# Patient Record
Sex: Female | Born: 1970 | Race: White | Hispanic: No | Marital: Married | Smoking: Never smoker
Health system: Southern US, Community
[De-identification: ages and names within clinical notes are randomized; demographics above are authoritative.]

## PROBLEM LIST (undated history)

## (undated) DIAGNOSIS — G250 Essential tremor: Secondary | ICD-10-CM

## (undated) DIAGNOSIS — F419 Anxiety disorder, unspecified: Secondary | ICD-10-CM

---

## 2000-03-17 ENCOUNTER — Encounter: Admission: RE | Admit: 2000-03-17 | Discharge: 2000-03-17 | Payer: Self-pay | Admitting: *Deleted

## 2000-03-17 ENCOUNTER — Encounter: Payer: Self-pay | Admitting: *Deleted

## 2003-12-30 ENCOUNTER — Other Ambulatory Visit: Admission: RE | Admit: 2003-12-30 | Discharge: 2003-12-30 | Payer: Self-pay | Admitting: Obstetrics and Gynecology

## 2011-04-07 ENCOUNTER — Other Ambulatory Visit (HOSPITAL_COMMUNITY)
Admission: RE | Admit: 2011-04-07 | Discharge: 2011-04-07 | Disposition: A | Discharge status: Home / Self Care | Payer: No Typology Code available for payment source | Source: Ambulatory Visit | Attending: Family Medicine | Admitting: Family Medicine

## 2011-04-07 ENCOUNTER — Other Ambulatory Visit: Payer: Self-pay | Admitting: Family Medicine

## 2011-04-07 DIAGNOSIS — Z124 Encounter for screening for malignant neoplasm of cervix: Secondary | ICD-10-CM | POA: Insufficient documentation

## 2011-04-07 DIAGNOSIS — Z1231 Encounter for screening mammogram for malignant neoplasm of breast: Secondary | ICD-10-CM

## 2011-04-07 DIAGNOSIS — Z1159 Encounter for screening for other viral diseases: Secondary | ICD-10-CM | POA: Insufficient documentation

## 2011-04-21 ENCOUNTER — Ambulatory Visit: Payer: Self-pay

## 2011-05-13 ENCOUNTER — Ambulatory Visit
Admission: RE | Admit: 2011-05-13 | Discharge: 2011-05-13 | Disposition: A | Discharge status: Home / Self Care | Payer: No Typology Code available for payment source | Source: Ambulatory Visit | Attending: Family Medicine | Admitting: Family Medicine

## 2011-05-13 DIAGNOSIS — Z1231 Encounter for screening mammogram for malignant neoplasm of breast: Secondary | ICD-10-CM

## 2015-04-30 ENCOUNTER — Other Ambulatory Visit: Payer: Self-pay | Admitting: Family Medicine

## 2015-04-30 DIAGNOSIS — N6310 Unspecified lump in the right breast, unspecified quadrant: Secondary | ICD-10-CM

## 2015-05-06 ENCOUNTER — Other Ambulatory Visit: Payer: Self-pay | Admitting: Family Medicine

## 2015-05-06 ENCOUNTER — Ambulatory Visit
Admission: RE | Admit: 2015-05-06 | Discharge: 2015-05-06 | Disposition: A | Discharge status: Home / Self Care | Payer: No Typology Code available for payment source | Source: Ambulatory Visit | Attending: Family Medicine | Admitting: Family Medicine

## 2015-05-06 DIAGNOSIS — N6310 Unspecified lump in the right breast, unspecified quadrant: Secondary | ICD-10-CM

## 2015-05-06 DIAGNOSIS — R928 Other abnormal and inconclusive findings on diagnostic imaging of breast: Secondary | ICD-10-CM

## 2015-05-16 ENCOUNTER — Ambulatory Visit
Admission: RE | Admit: 2015-05-16 | Discharge: 2015-05-16 | Disposition: A | Discharge status: Home / Self Care | Payer: No Typology Code available for payment source | Source: Ambulatory Visit | Attending: Family Medicine | Admitting: Family Medicine

## 2015-05-16 ENCOUNTER — Other Ambulatory Visit: Payer: Self-pay | Admitting: Family Medicine

## 2015-05-16 DIAGNOSIS — N6310 Unspecified lump in the right breast, unspecified quadrant: Secondary | ICD-10-CM

## 2015-05-16 DIAGNOSIS — R928 Other abnormal and inconclusive findings on diagnostic imaging of breast: Secondary | ICD-10-CM

## 2015-05-29 ENCOUNTER — Other Ambulatory Visit: Payer: Self-pay | Admitting: General Surgery

## 2015-05-29 DIAGNOSIS — N6091 Unspecified benign mammary dysplasia of right breast: Secondary | ICD-10-CM

## 2015-06-04 ENCOUNTER — Other Ambulatory Visit: Payer: Self-pay | Admitting: General Surgery

## 2015-06-04 DIAGNOSIS — N6091 Unspecified benign mammary dysplasia of right breast: Secondary | ICD-10-CM

## 2015-06-05 ENCOUNTER — Encounter (HOSPITAL_BASED_OUTPATIENT_CLINIC_OR_DEPARTMENT_OTHER): Payer: Self-pay | Admitting: *Deleted

## 2015-06-09 ENCOUNTER — Encounter (HOSPITAL_BASED_OUTPATIENT_CLINIC_OR_DEPARTMENT_OTHER)
Admission: RE | Admit: 2015-06-09 | Discharge: 2015-06-09 | Disposition: A | Discharge status: Home / Self Care | Payer: No Typology Code available for payment source | Source: Ambulatory Visit | Attending: General Surgery | Admitting: General Surgery

## 2015-06-09 DIAGNOSIS — N6091 Unspecified benign mammary dysplasia of right breast: Secondary | ICD-10-CM | POA: Diagnosis present

## 2015-06-09 DIAGNOSIS — D241 Benign neoplasm of right breast: Secondary | ICD-10-CM | POA: Diagnosis not present

## 2015-06-09 DIAGNOSIS — F419 Anxiety disorder, unspecified: Secondary | ICD-10-CM | POA: Diagnosis not present

## 2015-06-09 LAB — CBC WITH DIFFERENTIAL/PLATELET
BASOS PCT: 1 %
Basophils Absolute: 0.1 10*3/uL (ref 0.0–0.1)
EOS ABS: 0.2 10*3/uL (ref 0.0–0.7)
EOS PCT: 3 %
HCT: 39.2 % (ref 36.0–46.0)
Hemoglobin: 11.9 g/dL — ABNORMAL LOW (ref 12.0–15.0)
Lymphocytes Relative: 29 %
Lymphs Abs: 2 10*3/uL (ref 0.7–4.0)
MCH: 26.3 pg (ref 26.0–34.0)
MCHC: 30.4 g/dL (ref 30.0–36.0)
MCV: 86.7 fL (ref 78.0–100.0)
MONO ABS: 0.7 10*3/uL (ref 0.1–1.0)
MONOS PCT: 10 %
NEUTROS PCT: 57 %
Neutro Abs: 4.1 10*3/uL (ref 1.7–7.7)
PLATELETS: 313 10*3/uL (ref 150–400)
RBC: 4.52 MIL/uL (ref 3.87–5.11)
RDW: 15.6 % — AB (ref 11.5–15.5)
WBC: 7 10*3/uL (ref 4.0–10.5)

## 2015-06-09 LAB — COMPREHENSIVE METABOLIC PANEL
ALBUMIN: 3.9 g/dL (ref 3.5–5.0)
ALT: 13 U/L — ABNORMAL LOW (ref 14–54)
ANION GAP: 6 (ref 5–15)
AST: 16 U/L (ref 15–41)
Alkaline Phosphatase: 48 U/L (ref 38–126)
BUN: 9 mg/dL (ref 6–20)
CHLORIDE: 105 mmol/L (ref 101–111)
CO2: 27 mmol/L (ref 22–32)
Calcium: 9.9 mg/dL (ref 8.9–10.3)
Creatinine, Ser: 0.72 mg/dL (ref 0.44–1.00)
GFR calc Af Amer: >60 mL/min (ref >60)
GFR calc non Af Amer: >60 mL/min (ref >60)
GLUCOSE: 89 mg/dL (ref 65–99)
POTASSIUM: 4.6 mmol/L (ref 3.5–5.1)
SODIUM: 138 mmol/L (ref 135–145)
Total Bilirubin: 0.5 mg/dL (ref 0.3–1.2)
Total Protein: 7 g/dL (ref 6.5–8.1)

## 2015-06-09 NOTE — H&P (Signed)
Marie Massey  Location: [facility] Patient #: [id number] DOB: [birth year] Married / Language: English / Race: White Female       History of Present Illness  The patient is a 44 year old female who presents with a complaint of atypical ductal hyperplasia right breast 2. This is a delightful 44 year old Caucasian female. She is here with her husband throughout the encounter. She is referred by Dr. Theda Sers at the breast center  for evaluation and management of 2 palpable masses in the right breast lower inner quadrant which had been biopsied and showed fibroadenoma with atypical ductal hyperplasia.     The patient has no prior history of breast problems. She has a one-month history of feeling a lump in her right breast in the lower inner quadrant. She went to the [facility] [place]. Imaging studies show a 1.8 cm mass and a 1.7 cm mass in the lower inner quadrant, one at the 4 o'clock position and one at the 5 o'clock position. They found a cyst in the upper outer quadrant of the left breast. Biopsies of the 2 areas in the right breast showed fibroadenoma with atypical ductal hyperplasia in both areas. She's done well since the biopsy.    Family history is negative for breast cancer. Mother is adopted. Family history is negative for ovarian or pancreatic cancer. Past medical history is fairly negative. She is she has some anxiety and takes medication for that. She is married with 2 sons. 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  We had a very lengthy discussion about her 2 areas. We talked about the atypical hyperplasia and how it is a risk factor for cancer. We talked about the low but definite chance there may be in situ per cancer present. We talked about referral to high risk breast clinic. I advised her and her husband that , at this point in time, all that is indicated is conservative excision.  She has decided to go ahead with this procedure. She will  be scheduled for right breast lumpectomy 2 with double wire localization. These are too close together to use radioactive seed. I discussed the indications, details, techniques, and numerous risk of the surgery with her and her husband. She is aware of the risk of bleeding, infection, cosmetic deformity, further surgery if there is cancer present, nerve damage with chronic pain, and other unforeseen problems. She understands these issues well. At this time all of her questions were answered. She agrees with this plan.    Past Surgical History  Colon Polyp Removal - Colonoscopy  Diagnostic Studies History  Colonoscopy 1-5 years ago Mammogram within last year Pap Smear 1-5 years ago  Allergies  No Known Drug Allergies10/13/2016  Medication History  Sertraline HCl (100MG  Tablet, Oral) Active. Medications Reconciled  Social History  Alcohol use Occasional alcohol use. Caffeine use Tea. No drug use Tobacco use Never smoker.  Family History  Alcohol Abuse Father. Arthritis Mother. Depression Family Members In General. Diabetes Mellitus Family Members In General. Heart Disease Father. Hypertension Brother, Father, Son. Melanoma Brother, Mother. Respiratory Condition Son.  Pregnancy / Birth History  Age at menarche 42 years. Gravida 2 Maternal age 70-25 Para 2 Regular periods    Review of Systems  General Not Present- Appetite Loss, Chills, Fatigue, Fever, Night Sweats, Weight Gain and Weight Loss. Skin Not Present- Change in Wart/Mole, Dryness, Hives, Jaundice, New Lesions, Non-Healing Wounds, Rash and Ulcer. HEENT Present- Wears glasses/contact lenses. Not Present- Earache, Hearing Loss, Hoarseness, Nose Bleed, Oral Ulcers, Ringing  in the Ears, Seasonal Allergies, Sinus Pain, Sore Throat, Visual Disturbances and Yellow Eyes. Breast Present- Breast Mass. Not Present- Breast Pain, Nipple Discharge and Skin Changes. Cardiovascular Not Present-  Chest Pain, Difficulty Breathing Lying Down, Leg Cramps, Palpitations, Rapid Heart Rate, Shortness of Breath and Swelling of Extremities. Gastrointestinal Not Present- Abdominal Pain, Bloating, Bloody Stool, Change in Bowel Habits, Chronic diarrhea, Constipation, Difficulty Swallowing, Excessive gas, Gets full quickly at meals, Hemorrhoids, Indigestion, Nausea, Rectal Pain and Vomiting. Female Genitourinary Not Present- Frequency, Nocturia, Painful Urination, Pelvic Pain and Urgency. Musculoskeletal Not Present- Back Pain, Joint Pain, Joint Stiffness, Muscle Pain, Muscle Weakness and Swelling of Extremities. Neurological Present- Tremor. Not Present- Decreased Memory, Fainting, Headaches, Numbness, Seizures, Tingling, Trouble walking and Weakness. Psychiatric Not Present- Anxiety, Bipolar, Change in Sleep Pattern, Depression, Fearful and Frequent crying. Endocrine Not Present- Cold Intolerance, Excessive Hunger, Hair Changes, Heat Intolerance, Hot flashes and New Diabetes. Hematology Not Present- Easy Bruising, Excessive bleeding, Gland problems, HIV and Persistent Infections.  Vitals   Weight: 157 lb Height: 72in Body Surface Area: 1.9 m Body Mass Index: 21.29 kg/m  Temp.: 97.84F(Temporal)  Pulse: 70 (Regular)  BP: 124/76 (Sitting, Left Arm, Standard)     Physical Exam  General Mental Status-Alert. General Appearance-Consistent with stated age. Hydration-Well hydrated. Voice-Normal.  Head and Neck Head-normocephalic, atraumatic with no lesions or palpable masses. Trachea-midline. Thyroid Gland Characteristics - normal size and consistency.  Eye Eyeball - Bilateral-Extraocular movements intact. Sclera/Conjunctiva - Bilateral-No scleral icterus.  Chest and Lung Exam Chest and lung exam reveals -quiet, even and easy respiratory effort with no use of accessory muscles and on auscultation, normal breath sounds, no adventitious sounds and normal vocal  resonance. Inspection Chest Wall - Normal. Back - normal.  Breast Note: Right breast reveals a little bit of bruising but no hematoma. Medium size. I can feel two-1 cm masses in the lower inner quadrant, and agree that they are about at 4:00 and 5 o'clock position. There are about 2 or 3 cm apart from each other. Probably can be removed with single incision. No other mass in either breast. No other skin change. No axillary adenopathy.   Cardiovascular Cardiovascular examination reveals -normal heart sounds, regular rate and rhythm with no murmurs and normal pedal pulses bilaterally.  Abdomen Inspection Inspection of the abdomen reveals - No Hernias. Skin - Scar - no surgical scars. Palpation/Percussion Palpation and Percussion of the abdomen reveal - Soft, Non Tender, No Rebound tenderness, No Rigidity (guarding) and No hepatosplenomegaly. Auscultation Auscultation of the abdomen reveals - Bowel sounds normal.  Neurologic Neurologic evaluation reveals -alert and oriented x 3 with no impairment of recent or remote memory. Mental Status-Normal.  Musculoskeletal Normal Exam - Left-Upper Extremity Strength Normal and Lower Extremity Strength Normal. Normal Exam - Right-Upper Extremity Strength Normal and Lower Extremity Strength Normal.  Lymphatic Head & Neck  General Head & Neck Lymphatics: Bilateral - Description - Normal. Axillary  General Axillary Region: Bilateral - Description - Normal. Tenderness - Non Tender. Femoral & Inguinal  Generalized Femoral & Inguinal Lymphatics: Bilateral - Description - Normal. Tenderness - Non Tender.    Assessment & Plan  ATYPICAL DUCTAL HYPERPLASIA OF RIGHT BREAST (N60.91)  You are being scheduled for surgery - Our schedulers will call you. You have 2 palpable masses in the right breast, lower inner quadrant. Both of these have been biopsied and both show a fibroadenoma and both show atypical ductal hyperplasia Because of the  atypia, I have recommended that these areas be conservatively  excised We have discussed this in detail and we have decided to go ahead with the surgery You will be scheduled for right breast lumpectomy 2 with wire localization 2 We have discussed the indications, techniques, and numerous risks of this surgery in detail Please read the printed information that I have given you.  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 Dalbert Batman, M.D., [facility] General and Minimally invasive Surgery Breast and Colorectal Surgery Office:   [phone] Pager:   [phone]

## 2015-06-11 ENCOUNTER — Ambulatory Visit
Admission: RE | Admit: 2015-06-11 | Discharge: 2015-06-11 | Disposition: A | Discharge status: Home / Self Care | Payer: No Typology Code available for payment source | Source: Ambulatory Visit | Attending: General Surgery | Admitting: General Surgery

## 2015-06-11 ENCOUNTER — Ambulatory Visit (HOSPITAL_BASED_OUTPATIENT_CLINIC_OR_DEPARTMENT_OTHER): Payer: No Typology Code available for payment source | Admitting: Anesthesiology

## 2015-06-11 ENCOUNTER — Ambulatory Visit (HOSPITAL_BASED_OUTPATIENT_CLINIC_OR_DEPARTMENT_OTHER)
Admission: RE | Admit: 2015-06-11 | Discharge: 2015-06-11 | Disposition: A | Discharge status: Home / Self Care | Payer: No Typology Code available for payment source | Source: Ambulatory Visit | Attending: General Surgery | Admitting: General Surgery

## 2015-06-11 ENCOUNTER — Encounter (HOSPITAL_BASED_OUTPATIENT_CLINIC_OR_DEPARTMENT_OTHER): Payer: Self-pay | Admitting: *Deleted

## 2015-06-11 ENCOUNTER — Encounter (HOSPITAL_BASED_OUTPATIENT_CLINIC_OR_DEPARTMENT_OTHER)
Admission: RE | Disposition: A | Discharge status: Home / Self Care | Payer: Self-pay | Source: Ambulatory Visit | Attending: General Surgery

## 2015-06-11 DIAGNOSIS — F419 Anxiety disorder, unspecified: Secondary | ICD-10-CM | POA: Insufficient documentation

## 2015-06-11 DIAGNOSIS — D241 Benign neoplasm of right breast: Secondary | ICD-10-CM | POA: Diagnosis not present

## 2015-06-11 DIAGNOSIS — N6092 Unspecified benign mammary dysplasia of left breast: Secondary | ICD-10-CM | POA: Diagnosis present

## 2015-06-11 DIAGNOSIS — N6091 Unspecified benign mammary dysplasia of right breast: Secondary | ICD-10-CM

## 2015-06-11 DIAGNOSIS — D242 Benign neoplasm of left breast: Secondary | ICD-10-CM | POA: Diagnosis present

## 2015-06-11 HISTORY — DX: Anxiety disorder, unspecified: F41.9

## 2015-06-11 HISTORY — PX: BREAST LUMPECTOMY WITH NEEDLE LOCALIZATION: SHX5759

## 2015-06-11 HISTORY — DX: Essential tremor: G25.0

## 2015-06-11 SURGERY — BREAST LUMPECTOMY WITH NEEDLE LOCALIZATION
Anesthesia: General | Site: Breast | Laterality: Right

## 2015-06-11 MED ORDER — LIDOCAINE HCL (CARDIAC) 20 MG/ML IV SOLN
INTRAVENOUS | Status: AC
  Filled 2015-06-11: qty 5

## 2015-06-11 MED ORDER — BUPIVACAINE-EPINEPHRINE 0.5% -1:200000 IJ SOLN
INTRAMUSCULAR | Status: DC | PRN
  Administered 2015-06-11: 10 mL

## 2015-06-11 MED ORDER — ONDANSETRON HCL 4 MG/2ML IJ SOLN
INTRAMUSCULAR | Status: AC
  Filled 2015-06-11: qty 2

## 2015-06-11 MED ORDER — GLYCOPYRROLATE 0.2 MG/ML IJ SOLN
0.2000 mg | Freq: Once | INTRAMUSCULAR | Status: DC | PRN
Start: 2015-06-11 — End: 2015-06-11

## 2015-06-11 MED ORDER — MIDAZOLAM HCL 2 MG/2ML IJ SOLN
INTRAMUSCULAR | Status: AC
  Filled 2015-06-11: qty 4

## 2015-06-11 MED ORDER — KETOROLAC TROMETHAMINE 30 MG/ML IJ SOLN: 30.0000 mg | Freq: Once | INTRAMUSCULAR | Status: DC

## 2015-06-11 MED ORDER — FENTANYL CITRATE (PF) 100 MCG/2ML IJ SOLN
INTRAMUSCULAR | Status: AC
  Filled 2015-06-11: qty 4

## 2015-06-11 MED ORDER — HYDROCODONE-ACETAMINOPHEN 5-325 MG PO TABS
ORAL_TABLET | ORAL | Status: AC
  Filled 2015-06-11: qty 1

## 2015-06-11 MED ORDER — BUPIVACAINE-EPINEPHRINE (PF) 0.5% -1:200000 IJ SOLN
INTRAMUSCULAR | Status: AC
  Filled 2015-06-11: qty 30

## 2015-06-11 MED ORDER — SCOPOLAMINE 1 MG/3DAYS TD PT72
MEDICATED_PATCH | TRANSDERMAL | Status: AC
  Filled 2015-06-11: qty 1

## 2015-06-11 MED ORDER — DEXAMETHASONE SODIUM PHOSPHATE 4 MG/ML IJ SOLN
INTRAMUSCULAR | Status: DC | PRN
  Administered 2015-06-11: 10 mg via INTRAVENOUS

## 2015-06-11 MED ORDER — CEFAZOLIN SODIUM-DEXTROSE 2-3 GM-% IV SOLR
INTRAVENOUS | Status: AC
  Filled 2015-06-11: qty 50

## 2015-06-11 MED ORDER — MIDAZOLAM HCL 2 MG/2ML IJ SOLN
1.0000 mg | INTRAMUSCULAR | Status: DC | PRN
  Administered 2015-06-11: 2 mg via INTRAVENOUS

## 2015-06-11 MED ORDER — BUPIVACAINE-EPINEPHRINE (PF) 0.25% -1:200000 IJ SOLN
INTRAMUSCULAR | Status: AC
  Filled 2015-06-11: qty 30

## 2015-06-11 MED ORDER — PROMETHAZINE HCL 25 MG/ML IJ SOLN: 6.2500 mg | INTRAMUSCULAR | Status: DC | PRN

## 2015-06-11 MED ORDER — 0.9 % SODIUM CHLORIDE (POUR BTL) OPTIME
TOPICAL | Status: DC | PRN
  Administered 2015-06-11: 200 mL

## 2015-06-11 MED ORDER — ONDANSETRON HCL 4 MG/2ML IJ SOLN
INTRAMUSCULAR | Status: DC | PRN
  Administered 2015-06-11: 4 mg via INTRAVENOUS

## 2015-06-11 MED ORDER — LACTATED RINGERS IV SOLN
INTRAVENOUS | Status: DC
  Administered 2015-06-11: 11:00:00 via INTRAVENOUS

## 2015-06-11 MED ORDER — CEFAZOLIN SODIUM-DEXTROSE 2-3 GM-% IV SOLR
2.0000 g | INTRAVENOUS | Status: AC
  Administered 2015-06-11: 2 g via INTRAVENOUS

## 2015-06-11 MED ORDER — PROPOFOL 10 MG/ML IV BOLUS
INTRAVENOUS | Status: DC | PRN
  Administered 2015-06-11: 200 mg via INTRAVENOUS
  Administered 2015-06-11: 100 mg via INTRAVENOUS

## 2015-06-11 MED ORDER — HYDROCODONE-ACETAMINOPHEN 5-325 MG PO TABS: 1.0000 | ORAL_TABLET | Freq: Four times a day (QID) | ORAL | Status: DC | PRN

## 2015-06-11 MED ORDER — CHLORHEXIDINE GLUCONATE 4 % EX LIQD: 1.0000 "application " | Freq: Once | CUTANEOUS | Status: DC

## 2015-06-11 MED ORDER — HYDROCODONE-ACETAMINOPHEN 7.5-325 MG PO TABS: 1.0000 | ORAL_TABLET | Freq: Once | ORAL | Status: DC | PRN

## 2015-06-11 MED ORDER — DEXAMETHASONE SODIUM PHOSPHATE 10 MG/ML IJ SOLN
INTRAMUSCULAR | Status: AC
  Filled 2015-06-11: qty 1

## 2015-06-11 MED ORDER — SCOPOLAMINE 1 MG/3DAYS TD PT72
1.0000 | MEDICATED_PATCH | Freq: Once | TRANSDERMAL | Status: DC | PRN
  Administered 2015-06-11: 1.5 mg via TRANSDERMAL

## 2015-06-11 MED ORDER — HYDROMORPHONE HCL 1 MG/ML IJ SOLN: 0.2500 mg | INTRAMUSCULAR | Status: DC | PRN

## 2015-06-11 MED ORDER — HYDROCODONE-ACETAMINOPHEN 5-325 MG PO TABS
1.0000 | ORAL_TABLET | Freq: Once | ORAL | Status: AC | PRN
  Administered 2015-06-11: 1 via ORAL

## 2015-06-11 MED ORDER — FENTANYL CITRATE (PF) 100 MCG/2ML IJ SOLN
50.0000 ug | INTRAMUSCULAR | Status: DC | PRN
  Administered 2015-06-11: 100 ug via INTRAVENOUS

## 2015-06-11 SURGICAL SUPPLY — 61 items
APPLIER CLIP 11 MED OPEN (CLIP)
BANDAGE ELASTIC 6 VELCRO ST LF (GAUZE/BANDAGES/DRESSINGS) IMPLANT
BENZOIN TINCTURE PRP APPL 2/3 (GAUZE/BANDAGES/DRESSINGS) IMPLANT
BINDER BREAST LRG (GAUZE/BANDAGES/DRESSINGS) ×2 IMPLANT
BLADE HEX COATED 2.75 (ELECTRODE) ×2 IMPLANT
BLADE SURG 10 STRL SS (BLADE) IMPLANT
BLADE SURG 15 STRL LF DISP TIS (BLADE) ×1 IMPLANT
BLADE SURG 15 STRL SS (BLADE) ×1
CANISTER SUCT 1200ML W/VALVE (MISCELLANEOUS) ×2 IMPLANT
CHLORAPREP W/TINT 26ML (MISCELLANEOUS) ×2 IMPLANT
CLIP APPLIE 11 MED OPEN (CLIP) IMPLANT
COVER BACK TABLE 60X90IN (DRAPES) ×2 IMPLANT
COVER MAYO STAND STRL (DRAPES) ×2 IMPLANT
DECANTER SPIKE VIAL GLASS SM (MISCELLANEOUS) IMPLANT
DERMABOND ADVANCED (GAUZE/BANDAGES/DRESSINGS) ×1
DERMABOND ADVANCED .7 DNX12 (GAUZE/BANDAGES/DRESSINGS) ×1 IMPLANT
DEVICE DUBIN W/COMP PLATE 8390 (MISCELLANEOUS) ×2 IMPLANT
DRAIN CHANNEL 19F RND (DRAIN) IMPLANT
DRAIN HEMOVAC 1/8 X 5 (WOUND CARE) IMPLANT
DRAPE LAPAROSCOPIC ABDOMINAL (DRAPES) ×2 IMPLANT
DRAPE UTILITY XL STRL (DRAPES) ×2 IMPLANT
DRSG PAD ABDOMINAL 8X10 ST (GAUZE/BANDAGES/DRESSINGS) IMPLANT
ELECT REM PT RETURN 9FT ADLT (ELECTROSURGICAL) ×2
ELECTRODE REM PT RTRN 9FT ADLT (ELECTROSURGICAL) ×1 IMPLANT
EVACUATOR SILICONE 100CC (DRAIN) IMPLANT
GAUZE SPONGE 4X4 12PLY STRL (GAUZE/BANDAGES/DRESSINGS) ×2 IMPLANT
GAUZE SPONGE 4X4 16PLY XRAY LF (GAUZE/BANDAGES/DRESSINGS) IMPLANT
GLOVE BIOGEL PI IND STRL 7.0 (GLOVE) ×2 IMPLANT
GLOVE BIOGEL PI INDICATOR 7.0 (GLOVE) ×2
GLOVE ECLIPSE 6.5 STRL STRAW (GLOVE) ×2 IMPLANT
GLOVE EUDERMIC 7 POWDERFREE (GLOVE) ×2 IMPLANT
GLOVE SURG SS PI 7.0 STRL IVOR (GLOVE) ×2 IMPLANT
GOWN STRL REUS W/ TWL LRG LVL3 (GOWN DISPOSABLE) ×2 IMPLANT
GOWN STRL REUS W/ TWL XL LVL3 (GOWN DISPOSABLE) ×1 IMPLANT
GOWN STRL REUS W/TWL LRG LVL3 (GOWN DISPOSABLE) ×2
GOWN STRL REUS W/TWL XL LVL3 (GOWN DISPOSABLE) ×1
KIT MARKER MARGIN INK (KITS) ×2 IMPLANT
NEEDLE HYPO 25X1 1.5 SAFETY (NEEDLE) ×2 IMPLANT
NS IRRIG 1000ML POUR BTL (IV SOLUTION) ×2 IMPLANT
PACK BASIN DAY SURGERY FS (CUSTOM PROCEDURE TRAY) ×2 IMPLANT
PAD ALCOHOL SWAB (MISCELLANEOUS) ×2 IMPLANT
PENCIL BUTTON HOLSTER BLD 10FT (ELECTRODE) ×2 IMPLANT
PIN SAFETY STERILE (MISCELLANEOUS) IMPLANT
SLEEVE SCD COMPRESS KNEE MED (MISCELLANEOUS) ×2 IMPLANT
SPONGE GAUZE 4X4 12PLY STER LF (GAUZE/BANDAGES/DRESSINGS) IMPLANT
SPONGE LAP 18X18 X RAY DECT (DISPOSABLE) IMPLANT
SPONGE LAP 4X18 X RAY DECT (DISPOSABLE) IMPLANT
STRIP CLOSURE SKIN 1/2X4 (GAUZE/BANDAGES/DRESSINGS) IMPLANT
SUT ETHILON 3 0 FSL (SUTURE) IMPLANT
SUT MNCRL AB 4-0 PS2 18 (SUTURE) ×2 IMPLANT
SUT SILK 2 0 SH (SUTURE) ×2 IMPLANT
SUT VIC AB 2-0 CT1 27 (SUTURE)
SUT VIC AB 2-0 CT1 TAPERPNT 27 (SUTURE) IMPLANT
SUT VIC AB 3-0 SH 27 (SUTURE)
SUT VIC AB 3-0 SH 27X BRD (SUTURE) IMPLANT
SUT VICRYL 3-0 CR8 SH (SUTURE) ×2 IMPLANT
SYRINGE 10CC LL (SYRINGE) ×2 IMPLANT
TOWEL OR 17X24 6PK STRL BLUE (TOWEL DISPOSABLE) ×2 IMPLANT
TOWEL OR NON WOVEN STRL DISP B (DISPOSABLE) ×2 IMPLANT
TUBE CONNECTING 20X1/4 (TUBING) ×2 IMPLANT
YANKAUER SUCT BULB TIP NO VENT (SUCTIONS) ×2 IMPLANT

## 2015-06-11 NOTE — Transfer of Care (Signed)
Immediate Anesthesia Transfer of Care Note  Patient: Marie Massey  Procedure(s) Performed: Procedure(s): RIGHT BREAST LUMPECTOMY X 2  WITH 2 NEEDLE LOCALIZATION IN RIGHT BREAST (Right)  Patient Location: PACU  Anesthesia Type:General  Level of Consciousness: sedated  Airway & Oxygen Therapy: Patient Spontanous Breathing and Patient connected to face mask oxygen  Post-op Assessment: Report given to RN and Post -op Vital signs reviewed and stable  Post vital signs: Reviewed and stable  Last Vitals:  Filed Vitals:   06/11/15 1011  BP: 112/75  Pulse: 65  Temp: 36.6 C  Resp: 16    Complications: No apparent anesthesia complications

## 2015-06-11 NOTE — Discharge Instructions (Signed)
[facility] °Office Phone Number [phone] ° °BREAST BIOPSY/ PARTIAL MASTECTOMY: POST OP INSTRUCTIONS ° °Always review your discharge instruction sheet given to you by the facility where your surgery was performed. ° °IF YOU HAVE DISABILITY OR FAMILY LEAVE FORMS, YOU MUST BRING THEM TO THE OFFICE FOR PROCESSING.  DO NOT GIVE THEM TO YOUR DOCTOR. ° °1. A prescription for pain medication may be given to you upon discharge.  Take your pain medication as prescribed, if needed.  If narcotic pain medicine is not needed, then you may take acetaminophen (Tylenol) or ibuprofen (Advil) as needed. °2. Take your usually prescribed medications unless otherwise directed °3. If you need a refill on your pain medication, please contact your pharmacy.  They will contact our office to request authorization.  Prescriptions will not be filled after 5pm or on week-ends. °4. You should eat very light the first 24 hours after surgery, such as soup, crackers, pudding, etc.  Resume your normal diet the day after surgery. °5. Most patients will experience some swelling and bruising in the breast.  Ice packs and a good support bra will help.  Swelling and bruising can take several days to resolve.  °6. It is common to experience some constipation if taking pain medication after surgery.  Increasing fluid intake and taking a stool softener will usually help or prevent this problem from occurring.  A mild laxative (Milk of Magnesia or Miralax) should be taken according to package directions if there are no bowel movements after 48 hours. °7. Unless discharge instructions indicate otherwise, you may remove your bandages 24-48 hours after surgery, and you may shower at that time.  You may have steri-strips (small skin tapes) in place directly over the incision.  These strips should be left on the skin for 7-10 days.  If your surgeon used skin glue on the incision, you may shower in 24 hours.  The glue will flake off over the  next 2-3 weeks.  Any sutures or staples will be removed at the office during your follow-up visit. °8. ACTIVITIES:  You may resume regular daily activities (gradually increasing) beginning the next day.  Wearing a good support bra or sports bra minimizes pain and swelling.  You may have sexual intercourse when it is comfortable. °a. You may drive when you no longer are taking prescription pain medication, you can comfortably wear a seatbelt, and you can safely maneuver your car and apply brakes. °b. RETURN TO WORK:  ______________________________________________________________________________________ °9. You should see your doctor in the office for a follow-up appointment approximately two weeks after your surgery.  Your doctor’s nurse will typically make your follow-up appointment when she calls you with your pathology report.  Expect your pathology report 2-3 business days after your surgery.  You may call to check if you do not hear from us after three days. °10. OTHER INSTRUCTIONS: _______________________________________________________________________________________________ _____________________________________________________________________________________________________________________________________ °_____________________________________________________________________________________________________________________________________ °_____________________________________________________________________________________________________________________________________ ° °WHEN TO CALL YOUR DOCTOR: °1. Fever over 101.0 °2. Nausea and/or vomiting. °3. Extreme swelling or bruising. °4. Continued bleeding from incision. °5. Increased pain, redness, or drainage from the incision. ° °The clinic staff is available to answer your questions during regular business hours.  Please don’t hesitate to call and ask to speak to one of the nurses for clinical concerns.  If you have a medical emergency, go to the nearest  emergency room or call 911.  A surgeon from [facility] is always on call at the hospital. ° °For further questions, please visit [email or web address]  ° ° ° °  Post Anesthesia Home Care Instructions ° °Activity: °Get plenty of rest for the remainder of the day. A responsible adult should stay with you for 24 hours following the procedure.  °For the next 24 hours, DO NOT: °-Drive a car °-Operate machinery °-Drink alcoholic beverages °-Take any medication unless instructed by your physician °-Make any legal decisions or sign important papers. ° °Meals: °Start with liquid foods such as gelatin or soup. Progress to regular foods as tolerated. Avoid greasy, spicy, heavy foods. If nausea and/or vomiting occur, drink only clear liquids until the nausea and/or vomiting subsides. Call your physician if vomiting continues. ° °Special Instructions/Symptoms: °Your throat may feel dry or sore from the anesthesia or the breathing tube placed in your throat during surgery. If this causes discomfort, gargle with warm salt water. The discomfort should disappear within 24 hours. ° °If you had a scopolamine patch placed behind your ear for the management of post- operative nausea and/or vomiting: ° °1. The medication in the patch is effective for 72 hours, after which it should be removed.  Wrap patch in a tissue and discard in the trash. Wash hands thoroughly with soap and water. °2. You may remove the patch earlier than 72 hours if you experience unpleasant side effects which may include dry mouth, dizziness or visual disturbances. °3. Avoid touching the patch. Wash your hands with soap and water after contact with the patch. °  ° °

## 2015-06-11 NOTE — Anesthesia Preprocedure Evaluation (Signed)
Anesthesia Evaluation  Patient identified by MRN, date of birth, ID band Patient awake    Reviewed: Allergy & Precautions, NPO status , Patient's Chart, lab work & pertinent test results  Airway Mallampati: I  TM Distance: >3 FB Neck ROM: Full    Dental  (+) Teeth Intact, Dental Advisory Given   Pulmonary neg pulmonary ROS,    breath sounds clear to auscultation       Cardiovascular negative cardio ROS   Rhythm:Regular Rate:Normal     Neuro/Psych Anxiety negative neurological ROS     GI/Hepatic negative GI ROS, Neg liver ROS,   Endo/Other  negative endocrine ROS  Renal/GU negative Renal ROS     Musculoskeletal   Abdominal   Peds  Hematology negative hematology ROS (+)   Anesthesia Other Findings   Reproductive/Obstetrics                             Anesthesia Physical Anesthesia Plan  ASA: II  Anesthesia Plan: General   Post-op Pain Management:    Induction: Intravenous  Airway Management Planned: LMA  Additional Equipment:   Intra-op Plan:   Post-operative Plan:   Informed Consent: I have reviewed the patients History and Physical, chart, labs and discussed the procedure including the risks, benefits and alternatives for the proposed anesthesia with the patient or authorized representative who has indicated his/her understanding and acceptance.     Plan Discussed with: CRNA  Anesthesia Plan Comments:         Anesthesia Quick Evaluation

## 2015-06-11 NOTE — Anesthesia Procedure Notes (Signed)
Procedure Name: LMA Insertion Date/Time: 06/11/2015 11:35 AM Performed by: Lieutenant Diego Pre-anesthesia Checklist: Patient identified, Emergency Drugs available, Suction available and Patient being monitored Patient Re-evaluated:Patient Re-evaluated prior to inductionOxygen Delivery Method: Circle System Utilized Preoxygenation: Pre-oxygenation with 100% oxygen Intubation Type: IV induction Ventilation: Mask ventilation without difficulty LMA: LMA inserted LMA Size: 4.0 Number of attempts: 1 Airway Equipment and Method: Bite block Placement Confirmation: positive ETCO2 and breath sounds checked- equal and bilateral Tube secured with: Tape Dental Injury: Teeth and Oropharynx as per pre-operative assessment

## 2015-06-11 NOTE — Interval H&P Note (Signed)
History and Physical Interval Note:  06/11/2015 11:02 AM  Marie Massey  has presented today for surgery, with the diagnosis of fibroadenoma and atypical ductal hyperplasia right breast, lower inner quadrant  The various methods of treatment have been discussed with the patient and family. After consideration of risks, benefits and other options for treatment, the patient has consented to  Procedure(s): RIGHT BREAST LUMPECTOMY X 2  WITH 2 NEEDLE LOCALIZATION IN RIGHT BREAST (Right) X 2 as a surgical intervention .  The patient's history has been reviewed, patient examined, no change in status, stable for surgery.  I have reviewed the patient's chart and labs.  Questions were answered to the patient's satisfaction.     Adin Hector

## 2015-06-11 NOTE — OR Nursing (Signed)
Right Breast Tissue Specimen to Histology:  Yellow Pins mark clip @ E-11 and H-6

## 2015-06-11 NOTE — Anesthesia Postprocedure Evaluation (Signed)
  Anesthesia Post-op Note  Patient: Marie Massey  Procedure(s) Performed: Procedure(s): RIGHT BREAST LUMPECTOMY X 2  WITH 2 NEEDLE LOCALIZATION IN RIGHT BREAST (Right)  Patient Location: PACU  Anesthesia Type:General  Level of Consciousness: awake and alert   Airway and Oxygen Therapy: Patient Spontanous Breathing  Post-op Pain: none  Post-op Assessment: Post-op Vital signs reviewed              Post-op Vital Signs: Reviewed  Last Vitals:  Filed Vitals:   06/11/15 1415  BP: 124/64  Pulse: 74  Temp: 36.6 C  Resp: 16    Complications: No apparent anesthesia complications

## 2015-06-11 NOTE — Op Note (Signed)
Patient Name:           Marie Massey   Date of Surgery:        06/11/2015  Pre op Diagnosis:      Atypical ductal hyperplasia right breast, 2 separate areas  Post op Diagnosis:    Same  Procedure:                 Right breast partial mastectomy 2 with wire localization 2  Surgeon:                     Marie Massey, M.D., FACS  Assistant:                      OR staff  Operative Indications:   The patient is a 44 year old female who presents with a complaint of atypical ductal hyperplasia right breast 2. This is a delightful 44-year-old Caucasian female.She is referred by Dr. Collins at the breast center for evaluation and management of 2 palpable masses in the right breast lower inner quadrant which had been biopsied and showed fibroadenoma with atypical ductal hyperplasia.  The patient has no prior history of breast problems. She has a one-month history of feeling a lump in her right breast in the lower inner quadrant. She went to the [facility] [place]. Imaging studies show a 1.8 cm mass and a 1.7 cm mass in the lower inner quadrant, one at the 4 o'clock position and one at the 5 o'clock position. They found a cyst in the upper outer quadrant of the left breast. Biopsies of the 2 areas in the right breast showed fibroadenoma with atypical ductal hyperplasia in both areas. She's done well since the biopsy.  Family history is negative for breast cancer. Mother is adopted. Family history is negative for ovarian or pancreatic cancer.        Past medical history is fairly negative.  We had a very lengthy discussion about her 2 areas. We talked about the atypical hyperplasia and how it is a risk factor for cancer. We talked about the low but definite chance there may be in situ per cancer present. We talked about referral to high risk breast clinic. I advised her and her husband that , at this point in time, all that is indicated is conservative excision.   These areas are too close to each other to put radioactive seeds.  We decided to place 2 wires to be sure that we could remove both of these areas.  Operative Findings:       Both wires were placed from a medial to lateral orientation, but were well placed.  This allowed a transverse linear incision in the lower inner quadrant.  The specimen mammogram showed both wires and both masses and both clips to be present within the specimen.  Procedure in Detail:          Following the induction of general LMA anesthesia the patient's right breast was prepped and draped in a sterile fashion.  Intravenous antibiotics were given.  Surgical timeout was performed.  0.5% Marcaine with epinephrine was used as a local infiltration anesthetic.  After reviewing the imaging studies and the wire placement I elected to make a transverse linear incision between the 2 wires.  Dissection was carried down into the breast tissue around the localizing wires.  The specimen was marked with silk sutures and a 6 color ink kit to orient the pathologist.  Specimen   mammogram looked very good as described above.  The specimen was sent to the lab.  Hemostasis was excellent and achieved with  electrocautery.  Wound was irrigated.  The breast tissues were reapproximated with 3-0 Vicryl sutures and the skin closed with a running subcuticular 4-0 Monocryl and Dermabond.  Breast binder was placed and the patient taken to PACU in stable condition.  EBL 10 mL.  Counts correct.  Complications none.     Marie Massey, M.D., FACS General and Minimally Invasive Surgery Breast and Colorectal Surgery  06/11/2015 12:34 PM  

## 2015-06-12 ENCOUNTER — Encounter (HOSPITAL_BASED_OUTPATIENT_CLINIC_OR_DEPARTMENT_OTHER): Payer: Self-pay | Admitting: General Surgery

## 2015-07-09 DIAGNOSIS — Z23 Encounter for immunization: Secondary | ICD-10-CM | POA: Insufficient documentation

## 2015-07-09 DIAGNOSIS — Y9289 Other specified places as the place of occurrence of the external cause: Secondary | ICD-10-CM | POA: Insufficient documentation

## 2015-07-09 DIAGNOSIS — Y998 Other external cause status: Secondary | ICD-10-CM | POA: Diagnosis not present

## 2015-07-09 DIAGNOSIS — Z8669 Personal history of other diseases of the nervous system and sense organs: Secondary | ICD-10-CM | POA: Insufficient documentation

## 2015-07-09 DIAGNOSIS — F419 Anxiety disorder, unspecified: Secondary | ICD-10-CM | POA: Diagnosis not present

## 2015-07-09 DIAGNOSIS — Z79899 Other long term (current) drug therapy: Secondary | ICD-10-CM | POA: Insufficient documentation

## 2015-07-09 DIAGNOSIS — Y9389 Activity, other specified: Secondary | ICD-10-CM | POA: Diagnosis not present

## 2015-07-09 DIAGNOSIS — Z880 Allergy status to penicillin: Secondary | ICD-10-CM | POA: Diagnosis not present

## 2015-07-09 DIAGNOSIS — W268XXA Contact with other sharp object(s), not elsewhere classified, initial encounter: Secondary | ICD-10-CM | POA: Diagnosis not present

## 2015-07-09 DIAGNOSIS — S61214A Laceration without foreign body of right ring finger without damage to nail, initial encounter: Secondary | ICD-10-CM | POA: Insufficient documentation

## 2015-07-10 ENCOUNTER — Encounter (HOSPITAL_BASED_OUTPATIENT_CLINIC_OR_DEPARTMENT_OTHER): Payer: Self-pay | Admitting: Emergency Medicine

## 2015-07-10 ENCOUNTER — Emergency Department (HOSPITAL_BASED_OUTPATIENT_CLINIC_OR_DEPARTMENT_OTHER)
Admission: EM | Admit: 2015-07-10 | Discharge: 2015-07-10 | Disposition: A | Discharge status: Home / Self Care | Payer: No Typology Code available for payment source | Attending: Emergency Medicine | Admitting: Emergency Medicine

## 2015-07-10 DIAGNOSIS — S61219A Laceration without foreign body of unspecified finger without damage to nail, initial encounter: Secondary | ICD-10-CM

## 2015-07-10 MED ORDER — HYDROCODONE-ACETAMINOPHEN 5-325 MG PO TABS: 1.0000 | ORAL_TABLET | ORAL | Status: DC | PRN

## 2015-07-10 MED ORDER — TETANUS-DIPHTH-ACELL PERTUSSIS 5-2.5-18.5 LF-MCG/0.5 IM SUSP
0.5000 mL | Freq: Once | INTRAMUSCULAR | Status: AC
  Administered 2015-07-10: 0.5 mL via INTRAMUSCULAR
  Filled 2015-07-10: qty 0.5

## 2015-07-10 NOTE — ED Provider Notes (Signed)
TIME SEEN: 1:30 AM  CHIEF COMPLAINT: Right third digit laceration  HPI: Pt is a 44 y.o. female who presents emergency department with a skin avulsion to the right third digit. States that she was washing this is when a dish cut through the glove on her hand and sliced a piece of skin off of her right third finger. No other injury. She is unsure when her last tetanus vaccination was.  ROS: See HPI Constitutional: no fever  Eyes: no drainage  ENT: no runny nose   Cardiovascular:  no chest pain  Resp: no SOB  GI: no vomiting GU: no dysuria Integumentary: no rash  Allergy: no hives  Musculoskeletal: no leg swelling  Neurological: no slurred speech ROS otherwise negative  PAST MEDICAL HISTORY/PAST SURGICAL HISTORY:  Past Medical History  Diagnosis Date  . Anxiety   . Familial tremor     MEDICATIONS:  Prior to Admission medications   Medication Sig Start Date End Date Taking? Authorizing Provider  HYDROcodone-acetaminophen (NORCO) 5-325 MG tablet Take 1-2 tablets by mouth every 6 (six) hours as needed for moderate pain or severe pain. 06/11/15   Fanny Skates, MD  sertraline (ZOLOFT) 100 MG tablet Take 100 mg by mouth daily.    Historical Provider, MD    ALLERGIES:  Allergies  Allergen Reactions  . Penicillins Rash    SOCIAL HISTORY:  Social History  Substance Use Topics  . Smoking status: Never Smoker   . Smokeless tobacco: Never Used  . Alcohol Use: Yes     Comment: occas    FAMILY HISTORY: History reviewed. No pertinent family history.  EXAM: BP 132/74 mmHg  Pulse 79  Temp(Src) 97.9 F (36.6 C) (Oral)  Resp 18  Ht 6' (1.829 m)  Wt 156 lb (70.761 kg)  BMI 21.15 kg/m2  SpO2 100%  LMP 07/03/2015 CONSTITUTIONAL: Alert and oriented and responds appropriately to questions. Well-appearing; well-nourished HEAD: Normocephalic EYES: Conjunctivae clear, PERRL ENT: normal nose; no rhinorrhea; moist mucous membranes; pharynx without lesions noted NECK: Supple, no  meningismus, no LAD  CARD: RRR; S1 and S2 appreciated; no murmurs, no clicks, no rubs, no gallops RESP: Normal chest excursion without splinting or tachypnea; breath sounds clear and equal bilaterally; no wheezes, no rhonchi, no rales, no hypoxia or respiratory distress, speaking full sentences ABD/GI: Normal bowel sounds; non-distended; soft, non-tender, no rebound, no guarding, no peritoneal signs BACK:  The back appears normal and is non-tender to palpation, there is no CVA tenderness EXT: Normal ROM in all joints; non-tender to palpation; no edema; normal capillary refill; no cyanosis, no calf tenderness or swelling    SKIN: Normal color for age and race; warm; 3 x 1 cm area of avulsed skin with exposed subcutaneous tissue to the palmar aspect of the right third digit at the distal tip without tendon involvement NEURO: Moves all extremities equally, sensation to light touch intact diffusely, cranial nerves II through XII intact PSYCH: The patient's mood and manner are appropriate. Grooming and personal hygiene are appropriate.  MEDICAL DECISION MAKING: Patient here with an avulsion of skin to the palmar aspect of the right third digit. There is no tendon involvement and she is otherwise neurovascular intact distally. No other injury. There is no skin left that can be used to repair this wound and wound edges are not able to be pulled together to be sutured. This will have to heal by secondary intention.  I do not feel she needs to be on antibiotics at this time. We'll update  her tetanus vaccination. We have cleaned wound and applied bacitracin, Xeroform and sterile gauze. We'll have her follow-up with her PCP regularly for reevaluation. Discussed return precautions. Provided patient with pain medication to take as needed. She verbalized understanding and is comfortable with this plan.      [place], DO 07/10/15 [phone]

## 2015-07-10 NOTE — Discharge Instructions (Signed)

## 2015-07-10 NOTE — ED Notes (Signed)
Patient reports that she was washing dishes she cut her right middle finger.

## 2017-05-03 IMAGING — MG MM RT PLC BREAST LOC DEV   1ST LESION  INC MAMMO GUIDE
6 series · 6 of 6 positions shown · non-contrast
Comparison: Previous exams.

CLINICAL DATA: Patient with recent ultrasound-guided core needle
biopsy of 2 adjacent right breast masses demonstrating fibroadenomas
with ADH.

EXAM:
NEEDLE LOCALIZATION OF THE RIGHT BREAST WITH MAMMO GUIDANCE

[R ML (1 of 3)]
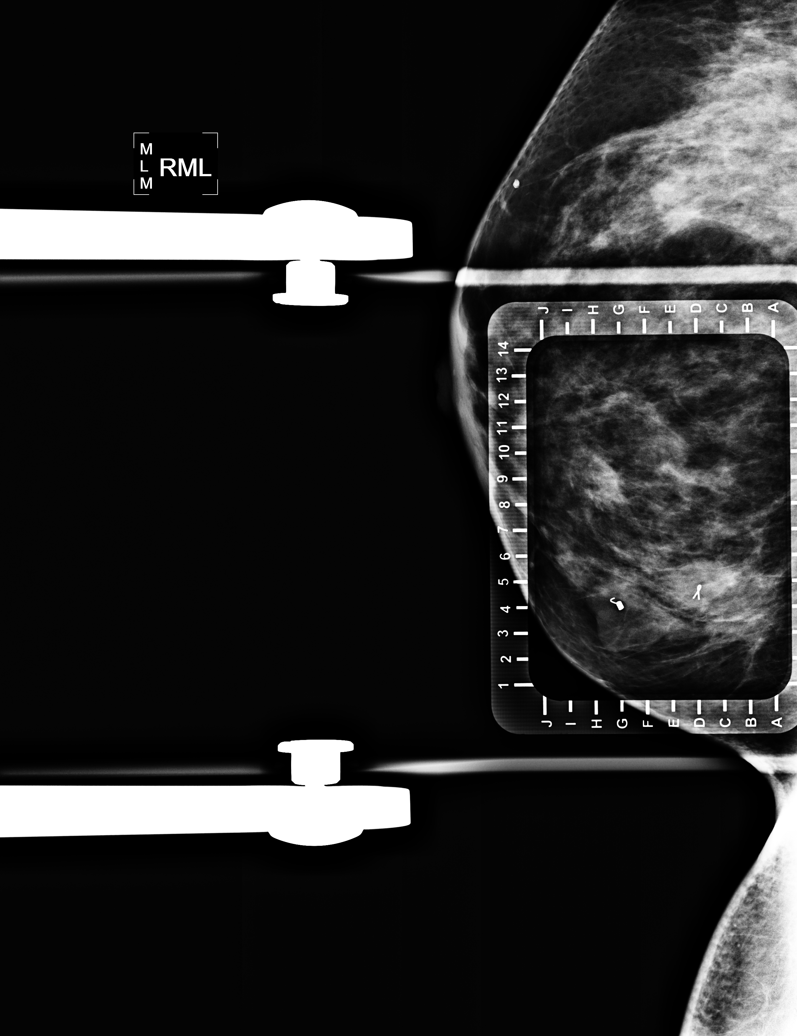

[R ML (2 of 3)]
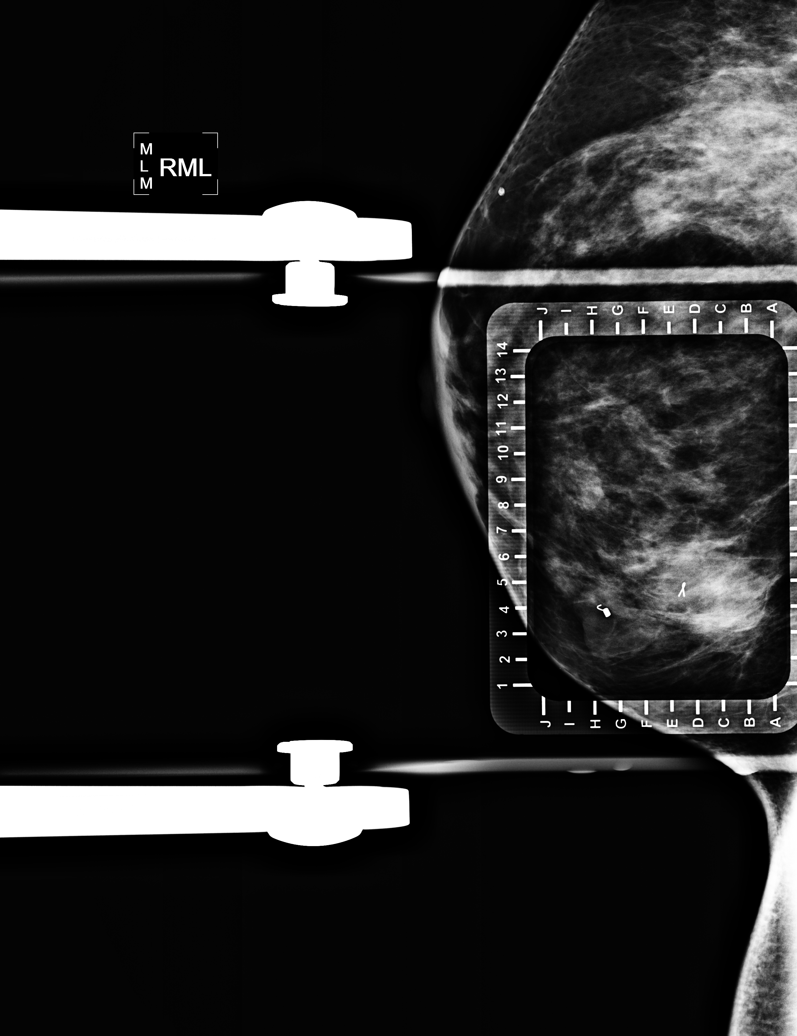

[R CC (1 of 3)]
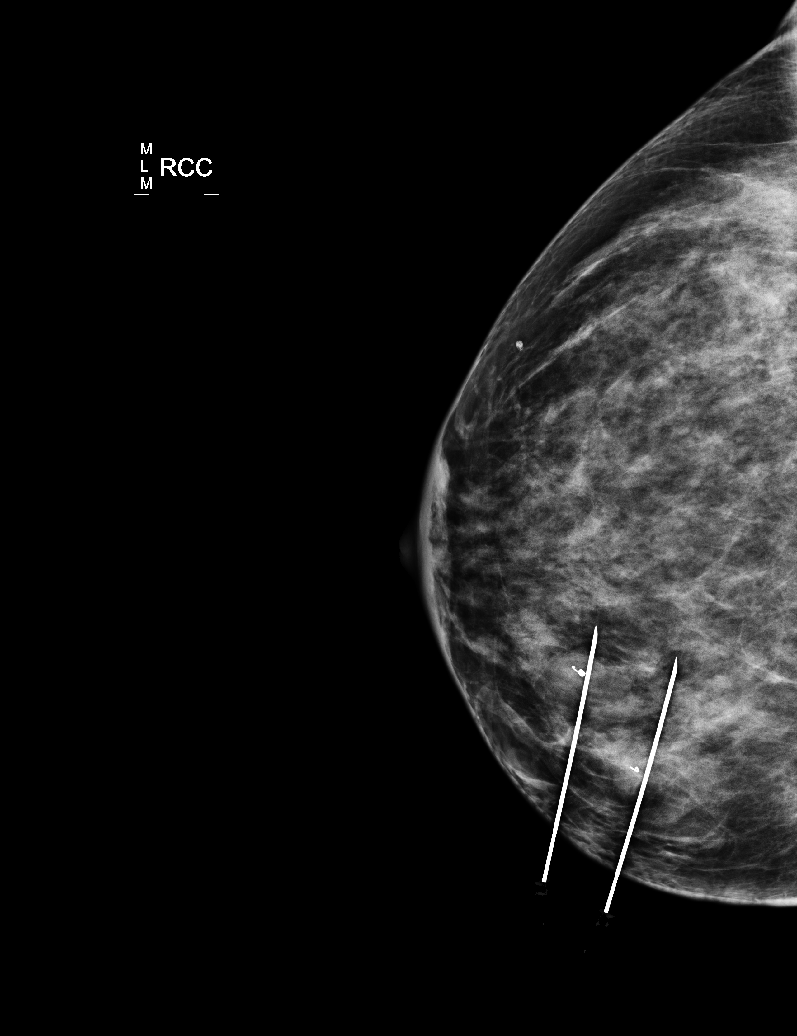

[R CC (2 of 3)]
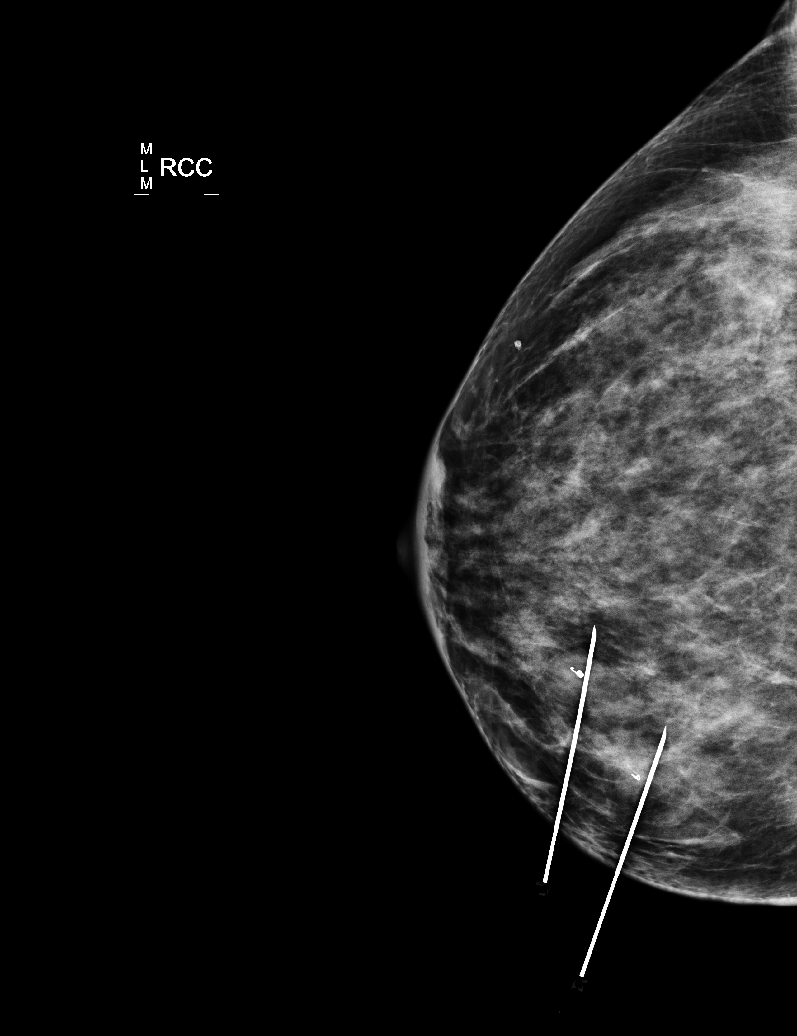

[R CC (3 of 3)]
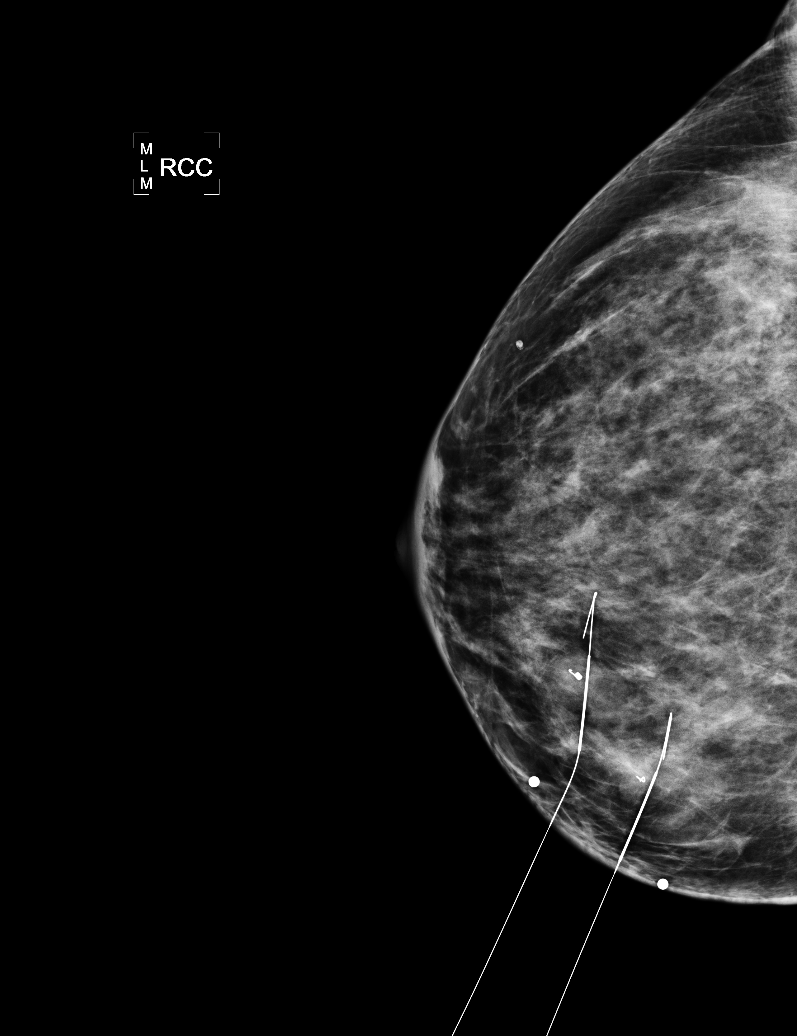

[R ML (3 of 3)]
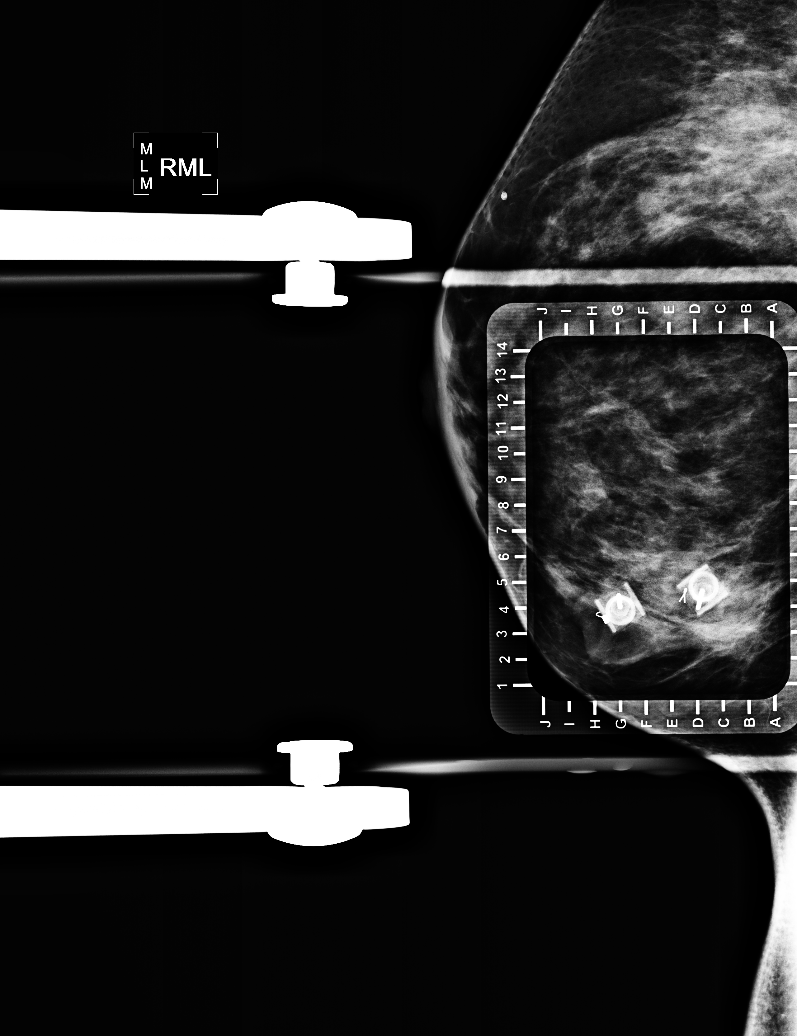

[6 of 6 positions shown; findings below may reference images not displayed]

FINDINGS: Patient presents for needle localization prior to right breast
surgical excision. I met with the patient and we discussed the
procedure of needle localization including benefits and
alternatives. We discussed the high likelihood of a successful
procedure. We discussed the risks of the procedure, including
infection, bleeding, tissue injury, and further surgery. Informed,
written consent was given. The usual time-out protocol was performed
immediately prior to the procedure.

Site 1:

Using mammographic guidance, sterile technique, 2% lidocaine and a 5
cm modified Kopans needle, mass and ribbon shaped marking clip were
localized using a medial approach.

Site 2:

Using mammographic guidance, sterile technique, 2% lidocaine and a 5
cm modified Kopans needle, mass and coil shaped marking clip were
localized using a medial approach.

The images were marked for Dr. Sandra Jose.
IMPRESSION: Needle localization of two adjacent masses within the medial right
breast. No apparent complications.

## 2017-10-10 ENCOUNTER — Encounter: Payer: Self-pay | Admitting: Neurology

## 2017-10-25 NOTE — Progress Notes (Signed)
Subjective:   Marie Massey was seen in consultation in the movement disorder clinic at the request of [name], [place].  The evaluation is for tremor. The records that were made available to me were reviewed from [name], [place] but nothing about tremor is contained therein. Pt is accompanied by her husband who supplements the history.  Tremor started approximately 10+ years ago and involves the bilateral UE.  She states that with the course of time she has noted "a bobble head" motion and husband notes that as well.  Husband thinks that it is in the "yes" direction.    Tremor is most noticeable when drinking, writing, eating.   There is a family hx of tremor in her mother/father/brother but it is mild and she wonders if they are anxiety related.    Affected by caffeine:  Yes.   (does not drink caffeine because of this) Affected by alcohol:  Unsure (drinks glass wine 3-4 days per week) Affected by stress:  Yes.   Affected by fatigue:  Not sure Spills soup if on spoon:  No. - but does have to be careful Spills glass of liquid if full:  Yes.   Affects ADL's (tying shoes, brushing teeth, etc):  No.  Notes worsening during the pre-menstrual period  Current/Previously tried tremor medications: none  Current medications that may exacerbate tremor:  n/a  Outside reports reviewed: historical medical records, office notes and referral letter/letters.  Allergies  Allergen Reactions  . Penicillins Rash    Outpatient Encounter Medications as of 10/27/2017  Medication Sig  . sertraline (ZOLOFT) 100 MG tablet Take 100 mg by mouth daily.  . [DISCONTINUED] HYDROcodone-acetaminophen (NORCO/VICODIN) 5-325 MG tablet Take 1 tablet by mouth every 4 (four) hours as needed.   No facility-administered encounter medications on file as of 10/27/2017.     Past Medical History:  Diagnosis Date  . Anxiety   . Familial tremor     Past Surgical History:  Procedure Laterality Date  .  BREAST LUMPECTOMY WITH NEEDLE LOCALIZATION Right 06/11/2015   Procedure: RIGHT BREAST LUMPECTOMY X 2  WITH 2 NEEDLE LOCALIZATION IN RIGHT BREAST;  Surgeon: Fanny Skates, MD;  Location: [facility];  Service: General;  Laterality: Right;    Social History   Socioeconomic History  . Marital status: Married    Spouse name: Not on file  . Number of children: Not on file  . Years of education: Not on file  . Highest education level: Not on file  Social Needs  . Financial resource strain: Not on file  . Food insecurity - worry: Not on file  . Food insecurity - inability: Not on file  . Transportation needs - medical: Not on file  . Transportation needs - non-medical: Not on file  Occupational History  . Not on file  Tobacco Use  . Smoking status: Never Smoker  . Smokeless tobacco: Never Used  Substance and Sexual Activity  . Alcohol use: Yes    Comment: wine 3-4 days a week  . Drug use: No  . Sexual activity: Not on file  Other Topics Concern  . Not on file  Social History Narrative  . Not on file    Family Status  Relation Name Status  . Mother  Alive  . Father  Alive  . Brother Social research officer, government  . Son x2 Alive    Review of Systems A complete 10 system ROS was obtained and was negative apart from what is mentioned.  Objective:   VITALS:   Vitals:   10/27/17 0940  BP: 120/84  Pulse: 74  SpO2: 95%  Weight: 173 lb (78.5 kg)  Height: 6' (1.829 m)   Gen:  Appears stated age and in NAD. HEENT:  Normocephalic, atraumatic. The mucous membranes are moist. The superficial temporal arteries are without ropiness or tenderness. Cardiovascular: Regular rate and rhythm. Lungs: Clear to auscultation bilaterally. Neck: There are no carotid bruits noted bilaterally.  NEUROLOGICAL:  Orientation:  The patient is alert and oriented x 3.  Recent and remote memory are intact.  Attention span and concentration are normal.  Able to name objects and repeat without trouble.   Fund of knowledge is appropriate Cranial nerves: There is good facial symmetry. The pupils are equal round and reactive to light bilaterally. Fundoscopic exam reveals clear disc margins bilaterally. Extraocular muscles are intact and visual fields are full to confrontational testing. Speech is fluent and clear. Soft palate rises symmetrically and there is no tongue deviation. Hearing is intact to conversational tone. Tone: Tone is good throughout. Sensation: Sensation is intact to light touch and pinprick throughout (facial, trunk, extremities). Vibration is intact at the bilateral big toe. There is no extinction with double simultaneous stimulation. There is no sensory dermatomal level identified. Coordination:  The patient has no dysdiadichokinesia or dysmetria. Motor: Strength is 5/5 in the bilateral upper and lower extremities.  Shoulder shrug is equal bilaterally.  There is no pronator drift.  There are no fasciculations noted. DTR's: Deep tendon reflexes are 2/4 at the bilateral biceps, triceps, brachioradialis, patella and achilles.  Plantar responses are downgoing bilaterally. Gait and Station: The patient is able to ambulate without difficulty. The patient is able to heel toe walk without any difficulty. The patient is able to ambulate in a tandem fashion. The patient is able to stand in the Romberg position.   MOVEMENT EXAM: Tremor:  There is tremor in the UE, noted most significantly with action.  The left is somewhat worse than the right (she is left-hand dominant).  When given a weight, tremor is just slightly increased.  The patient is no able to draw Archimedes spirals without significant difficulty.  There is no tremor at rest.  The patient is able to pour water from one glass to another and spills only a small amount.  Very mild head tremor in the "yes" direction is noted as she reports the water.  Labs: Called PCP.  Last labwork is 2015.     Assessment/Plan:   1.  Essential  Tremor.  -This is evidenced by the symmetrical nature and longstanding hx of gradually getting worse.  We discussed nature and pathophysiology.  We discussed that this can continue to gradually get worse with time.  We discussed that some medications can worsen this, as can caffeine use.  We discussed medication therapy as well as surgical therapy.  Ultimately, the patient decided to try primidone at low dose.  Risks, benefits, side effects and alternative therapies were discussed.  The opportunity to ask questions was given and they were answered to the best of my ability.  The patient expressed understanding and willingness to follow the outlined treatment protocols.  -We did discuss how anxiety can affect tremor and she admits she is quite anxious.  She asked about options for treatment and not involving medication.  We talked about weighted gloves.  We talked about the value of biofeedback and relaxation techniques.  Discussed integrative therapies and the phone number was given.  -The  patient does need lab work.  She has not had lab work since 2015.  We will do TSH, CBC, chemistry.  -She asked about potentially increasing her Zoloft.  That could be a treatment technique, if anxiety and mood are playing a strong role into tremor.  She can contact her primary care provider about this.  2.  Follow up is anticipated in the next few months, sooner should new neurologic issues arise.  Much greater than 50% of this visit was spent in counseling and coordinating care.  Total face to face time:  60 min  CC:  [name], PA-C

## 2017-10-27 ENCOUNTER — Encounter: Payer: Self-pay | Admitting: Neurology

## 2017-10-27 ENCOUNTER — Ambulatory Visit (INDEPENDENT_AMBULATORY_CARE_PROVIDER_SITE_OTHER): Payer: No Typology Code available for payment source | Admitting: Neurology

## 2017-10-27 ENCOUNTER — Other Ambulatory Visit: Payer: No Typology Code available for payment source

## 2017-10-27 VITALS — BP 120/84 | HR 74 | Ht 72.0 in | Wt 173.0 lb

## 2017-10-27 DIAGNOSIS — F419 Anxiety disorder, unspecified: Secondary | ICD-10-CM

## 2017-10-27 DIAGNOSIS — Z5181 Encounter for therapeutic drug level monitoring: Secondary | ICD-10-CM

## 2017-10-27 DIAGNOSIS — R251 Tremor, unspecified: Secondary | ICD-10-CM | POA: Diagnosis not present

## 2017-10-27 MED ORDER — PRIMIDONE 50 MG PO TABS: 50.0000 mg | ORAL_TABLET | Freq: Every day | ORAL | 3 refills | Status: AC

## 2017-10-27 NOTE — Patient Instructions (Signed)
1. Your provider has requested that you have labwork completed today. Please go to [facility] ([id number]) on the second floor of this building before leaving the office today. You do not need to check in. If you are not called within 15 minutes please check with the front desk.  2. Start Primidone 50 mg tablets. Take 1/2 tablet for 4 nights, then increase to 1 tablet. Prescription has been sent to your pharmacy. The first dose of medication can cause some dizziness/nausea that should go away after the first dose.  

## 2017-10-28 ENCOUNTER — Telehealth: Payer: Self-pay | Admitting: Neurology

## 2017-10-28 LAB — CBC WITH DIFFERENTIAL/PLATELET
BASOS PCT: 0.8 %
Basophils Absolute: 57 cells/uL (ref 0–200)
EOS ABS: 263 {cells}/uL (ref 15–500)
Eosinophils Relative: 3.7 %
HCT: 34.9 % — ABNORMAL LOW (ref 35.0–45.0)
Hemoglobin: 11.2 g/dL — ABNORMAL LOW (ref 11.7–15.5)
Lymphs Abs: 1889 cells/uL (ref 850–3900)
MCH: 24.9 pg — ABNORMAL LOW (ref 27.0–33.0)
MCHC: 32.1 g/dL (ref 32.0–36.0)
MCV: 77.6 fL — ABNORMAL LOW (ref 80.0–100.0)
MONOS PCT: 10.2 %
MPV: 11.1 fL (ref 7.5–12.5)
Neutro Abs: 4168 cells/uL (ref 1500–7800)
Neutrophils Relative %: 58.7 %
PLATELETS: 349 10*3/uL (ref 140–400)
RBC: 4.5 10*6/uL (ref 3.80–5.10)
RDW: 15.5 % — ABNORMAL HIGH (ref 11.0–15.0)
TOTAL LYMPHOCYTE: 26.6 %
WBC mixed population: 724 cells/uL (ref 200–950)
WBC: 7.1 10*3/uL (ref 3.8–10.8)

## 2017-10-28 LAB — COMPREHENSIVE METABOLIC PANEL
AG Ratio: 1.6 (calc) (ref 1.0–2.5)
ALT: 12 U/L (ref 6–29)
AST: 14 U/L (ref 10–35)
Albumin: 4.2 g/dL (ref 3.6–5.1)
Alkaline phosphatase (APISO): 52 U/L (ref 33–115)
BILIRUBIN TOTAL: 0.3 mg/dL (ref 0.2–1.2)
BUN: 15 mg/dL (ref 7–25)
CALCIUM: 9.7 mg/dL (ref 8.6–10.2)
CO2: 26 mmol/L (ref 20–32)
Chloride: 105 mmol/L (ref 98–110)
Creat: 0.75 mg/dL (ref 0.50–1.10)
GLUCOSE: 90 mg/dL (ref 65–99)
Globulin: 2.6 g/dL (calc) (ref 1.9–3.7)
Potassium: 5.2 mmol/L (ref 3.5–5.3)
SODIUM: 137 mmol/L (ref 135–146)
TOTAL PROTEIN: 6.8 g/dL (ref 6.1–8.1)

## 2017-10-28 LAB — TSH: TSH: 1.1 m[IU]/L

## 2017-10-28 NOTE — Telephone Encounter (Signed)
-----   Message from [place], DO sent at 10/28/2017  7:30 AM EDT ----- Let her know that labs look ok.  She is just mildly anemic but found labs from 2 years ago and its pretty stable since that time.

## 2017-10-28 NOTE — Telephone Encounter (Signed)
Patient made aware.

## 2017-11-04 ENCOUNTER — Encounter: Payer: Self-pay | Admitting: Neurology

## 2017-11-04 ENCOUNTER — Telehealth: Payer: Self-pay | Admitting: Neurology

## 2017-11-04 NOTE — Telephone Encounter (Signed)
Patient made aware her son's seizures would not be related to her tremors. I did let her know I would add it to her family history and make Dr. Carles Collet aware.   Dr. Carles Collet Juluis Rainier.

## 2017-11-04 NOTE — Telephone Encounter (Signed)
Patient wanted to let you know that her oldest son has had two grand mal seizures in the last year in half. She was not sure if the had anything to do with her DX or would help with the DX

## 2018-02-08 NOTE — Progress Notes (Deleted)
Subjective:   Marie Massey was seen in consultation in the movement disorder clinic at the request of [name], [place].  The evaluation is for tremor. The records that were made available to me were reviewed from [name], [place] but nothing about tremor is contained therein. Pt is accompanied by her husband who supplements the history.  Tremor started approximately 10+ years ago and involves the bilateral UE.  She states that with the course of time she has noted "a bobble head" motion and husband notes that as well.  Husband thinks that it is in the "yes" direction.    Tremor is most noticeable when drinking, writing, eating.   There is a family hx of tremor in her mother/father/brother but it is mild and she wonders if they are anxiety related.    Affected by caffeine:  Yes.   (does not drink caffeine because of this) Affected by alcohol:  Unsure (drinks glass wine 3-4 days per week) Affected by stress:  Yes.   Affected by fatigue:  Not sure Spills soup if on spoon:  No. - but does have to be careful Spills glass of liquid if full:  Yes.   Affects ADL's (tying shoes, brushing teeth, etc):  No.  Notes worsening during the pre-menstrual period  Current/Previously tried tremor medications: none  Current medications that may exacerbate tremor:  n/a  Outside reports reviewed: historical medical records, office notes and referral letter/letters.  02/09/18 update: Patient is seen today in follow-up.  She is accompanied by her husband who supplements the history.  Low-dose primidone was added last visit for tremor.  She reports today that ***.  Lab work was completed since our last visit.  She was mildly anemic, but that had not really changed in the last 2 years.  Allergies  Allergen Reactions  . Penicillins Rash    Outpatient Encounter Medications as of 02/09/2018  Medication Sig  . primidone (MYSOLINE) 50 MG tablet Take 1 tablet (50 mg total) by mouth at bedtime.  .  sertraline (ZOLOFT) 100 MG tablet Take 100 mg by mouth daily.   No facility-administered encounter medications on file as of 02/09/2018.     Past Medical History:  Diagnosis Date  . Anxiety   . Familial tremor     Past Surgical History:  Procedure Laterality Date  . BREAST LUMPECTOMY WITH NEEDLE LOCALIZATION Right 06/11/2015   Procedure: RIGHT BREAST LUMPECTOMY X 2  WITH 2 NEEDLE LOCALIZATION IN RIGHT BREAST;  Surgeon: Fanny Skates, MD;  Location: [facility];  Service: General;  Laterality: Right;    Social History   Socioeconomic History  . Marital status: Married    Spouse name: Not on file  . Number of children: Not on file  . Years of education: Not on file  . Highest education level: Not on file  Occupational History  . 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 Financial resource strain: Not on file  . Food insecurity:    Worry: Not on file    Inability: Not on file  . Transportation needs:    Medical: Not on file    Non-medical: Not on file  Tobacco Use  . Smoking status: Never Smoker  . Smokeless tobacco: Never Used  Substance and Sexual Activity  . Alcohol use: Yes    Comment: wine 3-4 days a week  . Drug use: No  . Sexual activity: Not on file  Lifestyle  . Physical activity:    Days per week: Not on file    Minutes per session: Not on file  . Stress: Not on file  Relationships  . Social connections:    Talks on phone: Not on file    Gets together: Not on file    Attends religious service: Not on file    Active member of club or organization: Not on file    Attends meetings of clubs or organizations: Not on file    Relationship status: Not on file  . Intimate partner violence:    Fear of current or ex partner: Not on file    Emotionally abused: Not on file    Physically abused: Not on file    Forced sexual activity: Not on file  Other Topics Concern  . Not on file  Social  History Narrative  . Not on file    Family Status  Relation Name Status  . Mother  Alive  . Father  Alive  . Brother Social research officer, government  . Son x2 Alive    Review of Systems A complete 10 system ROS was obtained and was negative apart from what is mentioned.   Objective:   VITALS:   There were no vitals filed for this visit. Gen:  Appears stated age and in NAD. HEENT:  Normocephalic, atraumatic. The mucous membranes are moist. The superficial temporal arteries are without ropiness or tenderness. Cardiovascular: Regular rate and rhythm. Lungs: Clear to auscultation bilaterally. Neck: There are no carotid bruits noted bilaterally.  NEUROLOGICAL:  Orientation:  The patient is alert and oriented x 3.  Recent and remote memory are intact.  Attention span and concentration are normal.  Able to name objects and repeat without trouble.  Fund of knowledge is appropriate Cranial nerves: There is good facial symmetry. The pupils are equal round and reactive to light bilaterally. Fundoscopic exam reveals clear disc margins bilaterally. Extraocular muscles are intact and visual fields are full to confrontational testing. Speech is fluent and clear. Soft palate rises symmetrically and there is no tongue deviation. Hearing is intact to conversational tone. Tone: Tone is good throughout. Sensation: Sensation is intact to light touch and pinprick throughout (facial, trunk, extremities). Vibration is intact at the bilateral big toe. There is no extinction with double simultaneous stimulation. There is no sensory dermatomal level identified. Coordination:  The patient has no dysdiadichokinesia or dysmetria. Motor: Strength is 5/5 in the bilateral upper and lower extremities.  Shoulder shrug is equal bilaterally.  There is no pronator drift.  There are no fasciculations noted. DTR's: Deep tendon reflexes are 2/4 at the bilateral biceps, triceps, brachioradialis, patella and achilles.  Plantar responses are  downgoing bilaterally. Gait and Station: The patient is able to ambulate without difficulty. The patient is able to heel toe walk without any difficulty. The patient is able to ambulate in a tandem fashion. The patient is able to stand in the Romberg position.   MOVEMENT EXAM: Tremor:  There is tremor in the UE, noted most significantly with action.  The left is somewhat worse than the right (she is left-hand dominant).  When given a weight, tremor is just slightly increased.  The patient is no able to draw Archimedes spirals without significant difficulty.  There is no tremor at rest.  The patient is able to pour water from one glass to another and spills only a small amount.  Very mild head tremor in the "yes" direction is noted as she reports the  water.  Labs:  Lab Results  Component Value Date   TSH 1.10 10/27/2017   Lab Results  Component Value Date   WBC 7.1 10/27/2017   HGB 11.2 (L) 10/27/2017   HCT 34.9 (L) 10/27/2017   MCV 77.6 (L) 10/27/2017   PLT 349 10/27/2017     Chemistry      Component Value Date/Time   NA 137 10/27/2017 1103   K 5.2 10/27/2017 1103   CL 105 10/27/2017 1103   CO2 26 10/27/2017 1103   BUN 15 10/27/2017 1103   CREATININE 0.75 10/27/2017 1103      Component Value Date/Time   CALCIUM 9.7 10/27/2017 1103   ALKPHOS 48 06/09/2015 1250   AST 14 10/27/2017 1103   ALT 12 10/27/2017 1103   BILITOT 0.3 10/27/2017 1103          Assessment/Plan:   1.  Essential Tremor.  -This is evidenced by the symmetrical nature and longstanding hx of gradually getting worse.  We discussed nature and pathophysiology.  We discussed that this can continue to gradually get worse with time.  We discussed that some medications can worsen this, as can caffeine use.  We discussed medication therapy as well as surgical therapy.  Ultimately, the patient decided to try primidone at low dose.  Risks, benefits, side effects and alternative therapies were discussed.  The opportunity  to ask questions was given and they were answered to the best of my ability.  The patient expressed understanding and willingness to follow the outlined treatment protocols.  -We did discuss how anxiety can affect tremor and she admits she is quite anxious.  She asked about options for treatment and not involving medication.  We talked about weighted gloves.  We talked about the value of biofeedback and relaxation techniques.  Discussed integrative therapies and the phone number was given.  -The patient does need lab work.  She has not had lab work since 2015.  We will do TSH, CBC, chemistry.  -She asked about potentially increasing her Zoloft.  That could be a treatment technique, if anxiety and mood are playing a strong role into tremor.  She can contact her primary care provider about this.  2.  ***  CC:  [name], PA-C

## 2018-02-09 ENCOUNTER — Ambulatory Visit: Payer: No Typology Code available for payment source | Admitting: Neurology
